# Patient Record
Sex: Female | Born: 1980 | Race: Black or African American | Hispanic: No | Marital: Married | Smoking: Former smoker
Health system: Southern US, Community
[De-identification: ages and names within clinical notes are randomized; demographics above are authoritative.]

## PROBLEM LIST (undated history)

## (undated) DIAGNOSIS — I10 Essential (primary) hypertension: Secondary | ICD-10-CM

## (undated) DIAGNOSIS — J45909 Unspecified asthma, uncomplicated: Secondary | ICD-10-CM

---

## 2015-01-04 ENCOUNTER — Encounter: Payer: Self-pay | Admitting: Emergency Medicine

## 2015-01-04 ENCOUNTER — Emergency Department
Admission: EM | Admit: 2015-01-04 | Discharge: 2015-01-04 | Disposition: A | Discharge status: Home / Self Care | Payer: Self-pay | Attending: Emergency Medicine | Admitting: Emergency Medicine

## 2015-01-04 DIAGNOSIS — I10 Essential (primary) hypertension: Secondary | ICD-10-CM | POA: Insufficient documentation

## 2015-01-04 DIAGNOSIS — R51 Headache: Secondary | ICD-10-CM | POA: Insufficient documentation

## 2015-01-04 DIAGNOSIS — Z87891 Personal history of nicotine dependence: Secondary | ICD-10-CM | POA: Insufficient documentation

## 2015-01-04 HISTORY — DX: Unspecified asthma, uncomplicated: J45.909

## 2015-01-04 HISTORY — DX: Essential (primary) hypertension: I10

## 2015-01-04 MED ORDER — LISINOPRIL 10 MG PO TABS
ORAL_TABLET | ORAL | Status: AC
  Administered 2015-01-04: 20 mg via ORAL
  Filled 2015-01-04: qty 2

## 2015-01-04 MED ORDER — LISINOPRIL 20 MG PO TABS: 20.0000 mg | ORAL_TABLET | Freq: Every day | ORAL | Status: DC

## 2015-01-04 MED ORDER — LISINOPRIL 10 MG PO TABS
20.0000 mg | ORAL_TABLET | Freq: Once | ORAL | Status: AC
  Administered 2015-01-04: 20 mg via ORAL

## 2015-01-04 MED ORDER — HYDROCHLOROTHIAZIDE 25 MG PO TABS: 25.0000 mg | ORAL_TABLET | Freq: Every day | ORAL | Status: DC

## 2015-01-04 MED ORDER — HYDROCHLOROTHIAZIDE 25 MG PO TABS
ORAL_TABLET | ORAL | Status: AC
  Filled 2015-01-04: qty 1

## 2015-01-04 MED ORDER — HYDROCHLOROTHIAZIDE 25 MG PO TABS
25.0000 mg | ORAL_TABLET | Freq: Every day | ORAL | Status: DC
  Administered 2015-01-04: 25 mg via ORAL

## 2015-01-04 NOTE — ED Notes (Signed)
Out of meds x 1 month - headache today

## 2015-01-04 NOTE — ED Notes (Signed)
Patient with no complaints at this time. Respirations even and unlabored. Skin warm/dry. Discharge instructions reviewed with patient at this time. Patient given opportunity to voice concerns/ask questions. Patient discharged at this time and left Emergency Department with steady gait.   

## 2015-01-04 NOTE — Discharge Instructions (Signed)
Hypertension Hypertension, commonly called high blood pressure, is when the force of blood pumping through your arteries is too strong. Your arteries are the blood vessels that carry blood from your heart throughout your body. A blood pressure reading consists of a higher number over a lower number, such as 110/72. The higher number (systolic) is the pressure inside your arteries when your heart pumps. The lower number (diastolic) is the pressure inside your arteries when your heart relaxes. Ideally you want your blood pressure below 120/80. Hypertension forces your heart to work harder to pump blood. Your arteries may become narrow or stiff. Having untreated or uncontrolled hypertension can cause heart attack, stroke, kidney disease, and other problems. RISK FACTORS Some risk factors for high blood pressure are controllable. Others are not.  Risk factors you cannot control include:   Race. You may be at higher risk if you are African American.  Age. Risk increases with age.  Gender. Men are at higher risk than women before age 45 years. After age 65, women are at higher risk than men. Risk factors you can control include:  Not getting enough exercise or physical activity.  Being overweight.  Getting too much fat, sugar, calories, or salt in your diet.  Drinking too much alcohol. SIGNS AND SYMPTOMS Hypertension does not usually cause signs or symptoms. Extremely high blood pressure (hypertensive crisis) may cause headache, anxiety, shortness of breath, and nosebleed. DIAGNOSIS To check if you have hypertension, your health care provider will measure your blood pressure while you are seated, with your arm held at the level of your heart. It should be measured at least twice using the same arm. Certain conditions can cause a difference in blood pressure between your right and left arms. A blood pressure reading that is higher than normal on one occasion does not mean that you need treatment. If  it is not clear whether you have high blood pressure, you may be asked to return on a different day to have your blood pressure checked again. Or, you may be asked to monitor your blood pressure at home for 1 or more weeks. TREATMENT Treating high blood pressure includes making lifestyle changes and possibly taking medicine. Living a healthy lifestyle can help lower high blood pressure. You may need to change some of your habits. Lifestyle changes may include:  Following the DASH diet. This diet is high in fruits, vegetables, and whole grains. It is low in salt, red meat, and added sugars.  Keep your sodium intake below 2,300 mg per day.  Getting at least 30-45 minutes of aerobic exercise at least 4 times per week.  Losing weight if necessary.  Not smoking.  Limiting alcoholic beverages.  Learning ways to reduce stress. Your health care provider may prescribe medicine if lifestyle changes are not enough to get your blood pressure under control, and if one of the following is true:  You are 18-59 years of age and your systolic blood pressure is above 140.  You are 60 years of age or older, and your systolic blood pressure is above 150.  Your diastolic blood pressure is above 90.  You have diabetes, and your systolic blood pressure is over 140 or your diastolic blood pressure is over 90.  You have kidney disease and your blood pressure is above 140/90.  You have heart disease and your blood pressure is above 140/90. Your personal target blood pressure may vary depending on your medical conditions, your age, and other factors. HOME CARE INSTRUCTIONS    Have your blood pressure rechecked as directed by your health care provider.   Take medicines only as directed by your health care provider. Follow the directions carefully. Blood pressure medicines must be taken as prescribed. The medicine does not work as well when you skip doses. Skipping doses also puts you at risk for  problems.  Do not smoke.   Monitor your blood pressure at home as directed by your health care provider. SEEK MEDICAL CARE IF:   You think you are having a reaction to medicines taken.  You have recurrent headaches or feel dizzy.  You have swelling in your ankles.  You have trouble with your vision. SEEK IMMEDIATE MEDICAL CARE IF:  You develop a severe headache or confusion.  You have unusual weakness, numbness, or feel faint.  You have severe chest or abdominal pain.  You vomit repeatedly.  You have trouble breathing. MAKE SURE YOU:   Understand these instructions.  Will watch your condition.  Will get help right away if you are not doing well or get worse.   This information is not intended to replace advice given to you by your health care provider. Make sure you discuss any questions you have with your health care provider.   Document Released: 01/17/2005 Document Revised: 06/03/2014 Document Reviewed: 11/09/2012 Elsevier Interactive Patient Education 2016 [employer]  

## 2015-01-04 NOTE — ED Provider Notes (Signed)
[facility] Emergency Department Provider Note  ____________________________________________  Time seen: Approximately 1745 PM  I have reviewed the triage vital signs and the nursing notes.   HISTORY  Chief Complaint Hypertension    HPI Vanessa Velazquez is a 34 y.o. female with a history of hypertension and asthma who is presenting today with elevated blood pressure. She says she also has a mild headache to the right side of her head on the frontal aspect. She says that she has been out of her blood pressure meds for about a month after moving. She says she has not been able to reestablish primary care and has no insurance. She says she has been out of her blood pressure medications in the past and feels exactly the same. She says that she also has intermittent dizziness when her blood pressure is up which is all typical of how she feels when she has been out of her meds previously. Denies any chest pain or shortness of breath.    Past Medical History  Diagnosis Date  . Hypertension   . Asthma     There are no active problems to display for this patient.   History reviewed. No pertinent past surgical history.  No current outpatient prescriptions on file.  Allergies Review of patient's allergies indicates no known allergies.  History reviewed. No pertinent family history.  Social History Social History  Substance Use Topics  . Smoking status: Former Games developer  . Smokeless tobacco: None  . Alcohol Use: No    Review of Systems Constitutional: No fever/chills Eyes: No visual changes. ENT: No sore throat. Cardiovascular: Denies chest pain. Respiratory: Denies shortness of breath. Gastrointestinal: No abdominal pain.  No nausea, no vomiting.  No diarrhea.  No constipation. Genitourinary: Negative for dysuria. Musculoskeletal: Negative for back pain. Skin: Negative for rash. Neurological: Negative for focal weakness or numbness.  10-point ROS  otherwise negative.  ____________________________________________   PHYSICAL EXAM:  VITAL SIGNS: ED Triage Vitals  Enc Vitals Group     BP 01/04/15 1717 191/103 mmHg     Pulse Rate 01/04/15 1717 85     Resp 01/04/15 1717 20     Temp 01/04/15 1717 98.1 F (36.7 C)     Temp src --      SpO2 01/04/15 1717 100 %     Weight 01/04/15 1717 370 lb (167.831 kg)     Height 01/04/15 1717  (1.676 m)     Head Cir --      Peak Flow --      Pain Score 01/04/15 1718 7     Pain Loc --      Pain Edu? --      Excl. in GC? --     Constitutional: Alert and oriented. Well appearing and in no acute distress. Eyes: Conjunctivae are normal. PERRL. EOMI. Head: Atraumatic. Nose: No congestion/rhinnorhea. Mouth/Throat: Mucous membranes are moist.  Oropharynx non-erythematous. Neck: No stridor.   Cardiovascular: Normal rate, regular rhythm. Grossly normal heart sounds.  Good peripheral circulation. Respiratory: Normal respiratory effort.  No retractions. Lungs CTAB. Gastrointestinal: Soft and nontender. No distention. No abdominal bruits. No CVA tenderness. Musculoskeletal: No lower extremity tenderness nor edema.  No joint effusions. Neurologic:  Normal speech and language. No gross focal neurologic deficits are appreciated. No gait instability. No ataxia on finger to nose testing. Skin:  Skin is warm, dry and intact. No rash noted. Psychiatric: Mood and affect are normal. Speech and behavior are normal.  ____________________________________________  LABS (all labs ordered are listed, but only abnormal results are displayed)  Labs Reviewed - No data to display ____________________________________________  EKG  ED ECG REPORT I, Schaevitz,  Teena Iraniavid M, the attending physician, personally viewed and interpreted this ECG.   Date: 01/04/2015  EKG Time: 1723  Rate: 83  Rhythm: normal EKG, normal sinus rhythm  Axis: Normal axis  Intervals:none  ST&T Change: No ST segment elevation or  depression. No abnormal T-wave inversion.  ____________________________________________  RADIOLOGY   ____________________________________________   PROCEDURES   ____________________________________________   INITIAL IMPRESSION / ASSESSMENT AND PLAN / ED COURSE  Pertinent labs & imaging results that were available during my care of the patient were reviewed by me and considered in my medical decision making (see chart for details).  ----------------------------------------- 7:38 PM on 01/04/2015 -----------------------------------------  Patient's blood pressure greatly improved after by mouth medications and she is now a symptomatically. I will discharge her with prescriptions for 1 month of her lisinopril as well as HCTZ. She [facility] and is in conversation with them to schedule a follow-up appointment. 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 She understands that she left establish primary care Her blood pressures followed. ____________________________________________   FINAL CLINICAL IMPRESSION(S) / ED DIAGNOSES  Uncontrolled hypertension.    Myrna Blazeravid Matthew Schaevitz, MD 01/04/15 [phone]

## 2015-03-02 ENCOUNTER — Emergency Department
Admission: EM | Admit: 2015-03-02 | Discharge: 2015-03-02 | Disposition: A | Discharge status: Home / Self Care | Payer: Self-pay | Attending: Emergency Medicine | Admitting: Emergency Medicine

## 2015-03-02 ENCOUNTER — Encounter: Payer: Self-pay | Admitting: Emergency Medicine

## 2015-03-02 DIAGNOSIS — Z87891 Personal history of nicotine dependence: Secondary | ICD-10-CM | POA: Insufficient documentation

## 2015-03-02 DIAGNOSIS — Z79899 Other long term (current) drug therapy: Secondary | ICD-10-CM | POA: Insufficient documentation

## 2015-03-02 DIAGNOSIS — I10 Essential (primary) hypertension: Secondary | ICD-10-CM | POA: Insufficient documentation

## 2015-03-02 DIAGNOSIS — H6991 Unspecified Eustachian tube disorder, right ear: Secondary | ICD-10-CM | POA: Insufficient documentation

## 2015-03-02 DIAGNOSIS — H6981 Other specified disorders of Eustachian tube, right ear: Secondary | ICD-10-CM

## 2015-03-02 MED ORDER — PREDNISONE 10 MG PO TABS: ORAL_TABLET | ORAL | Status: AC

## 2015-03-02 NOTE — ED Notes (Signed)
Pt states 2 weeks ago she woke up with left sided jaw and ear pain, denies injury to area.

## 2015-03-02 NOTE — ED Provider Notes (Signed)
[facility] Emergency Department Provider Note  ____________________________________________  Time seen: Approximately 8:56 AM  I have reviewed the triage vital signs and the nursing notes.   HISTORY  Chief Complaint Otalgia and Jaw Pain   HPI Vanessa Velazquez is a 35 y.o. female is here with complaint of bilateral jaw pain and neck pain for approximately 2 weeks. Patient states she noticed that her ears were popping at one time but now right ear is hurting worse than her left. Patient has not been taking any over-the-counter medication other than her allergy medicine which has given her some minimal relief. She denies any fever or chills. There's been no cough. Really she rates her pain as 4 out of 10.   Past Medical History  Diagnosis Date  . Hypertension   . Asthma     There are no active problems to display for this patient.   History reviewed. No pertinent past surgical history.  Current Outpatient Rx  Name  Route  Sig  Dispense  Refill  . hydrochlorothiazide (HYDRODIURIL) 25 MG tablet   Oral   Take 1 tablet (25 mg total) by mouth daily.   30 tablet   0   . lisinopril (PRINIVIL,ZESTRIL) 20 MG tablet   Oral   Take 1 tablet (20 mg total) by mouth daily.   30 tablet   0   . predniSONE (DELTASONE) 10 MG tablet      Take 3 tablets once a day for 3 days   9 tablet   0     Allergies Review of patient's allergies indicates no known allergies.  No family history on file.  Social History Social History  Substance Use Topics  . Smoking status: Former Games developer  . Smokeless tobacco: None  . Alcohol Use: No    Review of Systems Constitutional: No fever/chills Eyes: No visual changes. ENT: No sore throat. Positive ear pain, positive jaw pain. Cardiovascular: Denies chest pain. Respiratory: Denies shortness of breath. Gastrointestinal: No abdominal pain.  No nausea, no vomiting.   Musculoskeletal: Negative for back pain. Skin: Negative  for rash. Neurological: Negative for headaches, focal weakness or numbness.  10-point ROS otherwise negative.  ____________________________________________   PHYSICAL EXAM:  VITAL SIGNS: ED Triage Vitals  Enc Vitals Group     BP 03/02/15 0829 158/77 mmHg     Pulse Rate 03/02/15 0829 79     Resp 03/02/15 0829 18     Temp 03/02/15 0829 98.1 F (36.7 C)     Temp Source 03/02/15 0829 Oral     SpO2 03/02/15 0829 99 %     Weight 03/02/15 0829 370 lb (167.831 kg)     Height 03/02/15 0829  (1.676 m)     Head Cir --      Peak Flow --      Pain Score 03/02/15 0829 4     Pain Loc --      Pain Edu? --      Excl. in GC? --     Constitutional: Alert and oriented. Well appearing and in no acute distress. Eyes: Conjunctivae are normal. PERRL. EOMI. Head: Atraumatic. Nose: No congestion/rhinnorhea.  Right TM with mild fluid, no erythema, slight bulge and poor light reflex. Left EAC and TM are clear. Mouth/Throat: Mucous membranes are moist.  Oropharynx non-erythematous. Mild posterior drainage. No tenderness on palpation of the TMJ bilaterally. No dental caries were noted. Neck: No stridor.  Supple. Range of motion is without any difficulty or restrictions. Hematological/Lymphatic/Immunilogical:  No cervical lymphadenopathy. Cardiovascular: Normal rate, regular rhythm. Grossly normal heart sounds.  Good peripheral circulation. Respiratory: Normal respiratory effort.  No retractions. Lungs CTAB. Gastrointestinal: Soft and nontender. No distention. Musculoskeletal: No lower extremity tenderness nor edema.  No joint effusions. Neurologic:  Normal speech and language. No gross focal neurologic deficits are appreciated. No gait instability. Skin:  Skin is warm, dry and intact. No rash noted. Psychiatric: Mood and affect are normal. Speech and behavior are normal.  ____________________________________________   LABS (all labs ordered are listed, but only abnormal results are  displayed)  Labs Reviewed - No data to display  PROCEDURES  Procedure(s) performed: None  Critical Care performed: No  ____________________________________________   INITIAL IMPRESSION / ASSESSMENT AND PLAN / ED COURSE  Pertinent labs & imaging results that were available during my care of the patient were reviewed by me and considered in my medical decision making (see chart for details).  Patient was unable to give this to pop all in the emergency room. She was given a prescription for prednisone is 30 mg for 3 days and also to continue her allergy medication. She'll follow-up with [facility] if any continued problems. ____________________________________________   FINAL CLINICAL IMPRESSION(S) / ED DIAGNOSES  Final diagnoses:  Eustachian tube dysfunction, right      Tommi Rumps, PA-C 03/02/15 1525  Jeanmarie Plant, MD 03/02/15 [phone]

## 2015-03-02 NOTE — ED Notes (Signed)
States she developed some discomfort to bilateral jaw area and neck pain about 2 weeks ago  No fever   But states she has noticed her ears popping   Min relief with OTC allergy meds

## 2015-03-02 NOTE — Discharge Instructions (Signed)
Follow-up with [facility] if any continued problems. Take medication as directed. Increase fluids and take Tylenol if needed for pain.

## 2015-03-18 ENCOUNTER — Encounter: Payer: Self-pay | Admitting: *Deleted

## 2015-03-18 ENCOUNTER — Emergency Department
Admission: EM | Admit: 2015-03-18 | Discharge: 2015-03-18 | Disposition: A | Discharge status: Home / Self Care | Payer: Self-pay | Attending: Emergency Medicine | Admitting: Emergency Medicine

## 2015-03-18 DIAGNOSIS — R05 Cough: Secondary | ICD-10-CM

## 2015-03-18 DIAGNOSIS — J45901 Unspecified asthma with (acute) exacerbation: Secondary | ICD-10-CM | POA: Insufficient documentation

## 2015-03-18 DIAGNOSIS — Z87891 Personal history of nicotine dependence: Secondary | ICD-10-CM | POA: Insufficient documentation

## 2015-03-18 DIAGNOSIS — J019 Acute sinusitis, unspecified: Secondary | ICD-10-CM | POA: Insufficient documentation

## 2015-03-18 DIAGNOSIS — H9201 Otalgia, right ear: Secondary | ICD-10-CM | POA: Insufficient documentation

## 2015-03-18 DIAGNOSIS — Z79899 Other long term (current) drug therapy: Secondary | ICD-10-CM | POA: Insufficient documentation

## 2015-03-18 DIAGNOSIS — R059 Cough, unspecified: Secondary | ICD-10-CM

## 2015-03-18 MED ORDER — FLUTICASONE PROPIONATE 50 MCG/ACT NA SUSP: 2.0000 | Freq: Every day | NASAL | Status: AC

## 2015-03-18 MED ORDER — PSEUDOEPH-BROMPHEN-DM 30-2-10 MG/5ML PO SYRP: 10.0000 mL | ORAL_SOLUTION | Freq: Four times a day (QID) | ORAL | Status: AC | PRN

## 2015-03-18 MED ORDER — AMOXICILLIN 875 MG PO TABS: 875.0000 mg | ORAL_TABLET | Freq: Two times a day (BID) | ORAL | Status: AC

## 2015-03-18 NOTE — ED Provider Notes (Signed)
[facility] Emergency Department Provider Note  ____________________________________________  Time seen: Approximately 3:21 PM  I have reviewed the triage vital signs and the nursing notes.   HISTORY  Chief Complaint Cough and Nasal Congestion    HPI Vanessa Velazquez is a 35 y.o. female , NAD, presents to emergency with one-week history cough, chest congestion, nasal congestion. Was seen here recently for right-sided ear pain and popping that she states has continued since her visit. Denies any discharge from the ears. Has not had any fever, chills, body aches. Does have history of asthma and utilized her albuterol rescue inhaler once last night due to shortness of breath. Denies chest pain or back pain.   Past Medical History  Diagnosis Date  . Hypertension   . Asthma     There are no active problems to display for this patient.   History reviewed. No pertinent past surgical history.  Current Outpatient Rx  Name  Route  Sig  Dispense  Refill  . amoxicillin (AMOXIL) 875 MG tablet   Oral   Take 1 tablet (875 mg total) by mouth 2 (two) times daily.   20 tablet   0   . brompheniramine-pseudoephedrine-DM 30-2-10 MG/5ML syrup   Oral   Take 10 mLs by mouth 4 (four) times daily as needed.   200 mL   0   . fluticasone (FLONASE) 50 MCG/ACT nasal spray   Each Nare   Place 2 sprays into both nostrils daily.   16 g   0   . hydrochlorothiazide (HYDRODIURIL) 25 MG tablet   Oral   Take 1 tablet (25 mg total) by mouth daily.   30 tablet   0   . lisinopril (PRINIVIL,ZESTRIL) 20 MG tablet   Oral   Take 1 tablet (20 mg total) by mouth daily.   30 tablet   0   . predniSONE (DELTASONE) 10 MG tablet      Take 3 tablets once a day for 3 days   9 tablet   0     Allergies Review of patient's allergies indicates no known allergies.  History reviewed. No pertinent family history.  Social History Social History  Substance Use Topics  . Smoking  status: Former Games developer  . Smokeless tobacco: None  . Alcohol Use: No     Review of Systems  Constitutional: No fever/chills. Some fatigue Eyes: No visual changes. No discharge ENT: Positive nasal congestion, runny nose, sinus pressure, ear pain/pressure. No sore throat. Cardiovascular: No chest pain. Respiratory: Positive cough, shortness of breath. No wheezing.  Gastrointestinal: No abdominal pain.  No nausea, vomiting.   Musculoskeletal: Negative for general myalgias.  Skin: Negative for rash. Neurological: Negative for headaches, focal weakness or numbness. 10-point ROS otherwise negative.  ____________________________________________   PHYSICAL EXAM:  VITAL SIGNS: ED Triage Vitals  Enc Vitals Group     BP 03/18/15 1418 186/103 mmHg     Pulse Rate 03/18/15 1418 91     Resp 03/18/15 1418 20     Temp 03/18/15 1418 98.1 F (36.7 C)     Temp Source 03/18/15 1418 Oral     SpO2 03/18/15 1418 98 %     Weight 03/18/15 1418 370 lb (167.831 kg)     Height 03/18/15 1418  (1.676 m)     Head Cir --      Peak Flow --      Pain Score 03/18/15 1418 0     Pain Loc --  Pain Edu? --      Excl. in GC? --     Constitutional: Alert and oriented. Well appearing and in no acute distress. Eyes: Conjunctivae are normal. PERRL. EOMI without pain.  Head: Atraumatic. ENT:      Ears: Right TM visualized with trace serous effusion, bulging, decreased light reflex, injection. No perforation visualized. Left TM within normal limits. Bilateral external ear canals without erythema, swelling, discharge.      Nose: Moderate congestion with trace rhinnorhea.      Mouth/Throat: Mucous membranes are moist. Pharynx without erythema, swelling, exudate. White postnasal drip noted. Neck: Supple with full range of motion. Hematological/Lymphatic/Immunilogical: No cervical lymphadenopathy. Cardiovascular: Normal rate, regular rhythm. Normal S1 and S2.   Respiratory: Normal respiratory effort  without tachypnea or retractions. Lungs CTAB. Skin:  Skin is warm, dry and intact. No rash noted. Psychiatric: Mood and affect are normal. Speech and behavior are normal. Patient exhibits appropriate insight and judgement.    ____________________________________________   LABS  None  ____________________________________________  EKG  None ____________________________________________  RADIOLOGY  None  ____________________________________________    PROCEDURES  Procedure(s) performed: None    Medications - No data to display   ____________________________________________   INITIAL IMPRESSION / ASSESSMENT AND PLAN / ED COURSE  Patient's diagnosis is consistent with acute bacterial sinusitis with cough. Patient will be discharged home with prescriptions for oxacillin 875 mg tablets to take one tablet by mouth twice daily for 10 days. Will also give Flonase nasal spray as well as Bromfed-DM cough syrup to use as directed. Patient is to follow up with [facility] if symptoms persist past this treatment course. Patient is given ED precautions to return to the ED for any worsening or new symptoms.    ____________________________________________  FINAL CLINICAL IMPRESSION(S) / ED DIAGNOSES  Final diagnoses:  Acute sinusitis, recurrence not specified, unspecified location  Cough      NEW MEDICATIONS STARTED DURING THIS VISIT:  New Prescriptions   AMOXICILLIN (AMOXIL) 875 MG TABLET    Take 1 tablet (875 mg total) by mouth 2 (two) times daily.   BROMPHENIRAMINE-PSEUDOEPHEDRINE-DM 30-2-10 MG/5ML SYRUP    Take 10 mLs by mouth 4 (four) times daily as needed.   FLUTICASONE (FLONASE) 50 MCG/ACT NASAL SPRAY    Place 2 sprays into both nostrils daily.         Hope Pigeon, PA-C 03/18/15 1534  Rockne Menghini, MD 03/18/15 1537

## 2015-03-18 NOTE — Discharge Instructions (Signed)

## 2015-03-18 NOTE — ED Notes (Signed)
States cough and nasal congestion for about a week, denies any productive sputum

## 2015-04-19 DIAGNOSIS — I10 Essential (primary) hypertension: Secondary | ICD-10-CM | POA: Insufficient documentation

## 2015-04-19 DIAGNOSIS — Z7951 Long term (current) use of inhaled steroids: Secondary | ICD-10-CM | POA: Insufficient documentation

## 2015-04-19 DIAGNOSIS — Z792 Long term (current) use of antibiotics: Secondary | ICD-10-CM | POA: Insufficient documentation

## 2015-04-19 DIAGNOSIS — Z9119 Patient's noncompliance with other medical treatment and regimen: Secondary | ICD-10-CM | POA: Insufficient documentation

## 2015-04-19 DIAGNOSIS — Z87891 Personal history of nicotine dependence: Secondary | ICD-10-CM | POA: Insufficient documentation

## 2015-04-19 DIAGNOSIS — Z3202 Encounter for pregnancy test, result negative: Secondary | ICD-10-CM | POA: Insufficient documentation

## 2015-04-19 DIAGNOSIS — Z79899 Other long term (current) drug therapy: Secondary | ICD-10-CM | POA: Insufficient documentation

## 2015-04-19 LAB — BASIC METABOLIC PANEL
ANION GAP: 3 — AB (ref 5–15)
BUN: 15 mg/dL (ref 6–20)
CALCIUM: 8 mg/dL — AB (ref 8.9–10.3)
CO2: 28 mmol/L (ref 22–32)
CREATININE: 0.89 mg/dL (ref 0.44–1.00)
Chloride: 103 mmol/L (ref 101–111)
Glucose, Bld: 177 mg/dL — ABNORMAL HIGH (ref 65–99)
Potassium: 3.8 mmol/L (ref 3.5–5.1)
SODIUM: 134 mmol/L — AB (ref 135–145)

## 2015-04-19 MED ORDER — ACETAMINOPHEN 325 MG PO TABS
650.0000 mg | ORAL_TABLET | Freq: Once | ORAL | Status: AC
  Administered 2015-04-19: 650 mg via ORAL

## 2015-04-19 MED ORDER — ACETAMINOPHEN 325 MG PO TABS
ORAL_TABLET | ORAL | Status: AC
  Filled 2015-04-19: qty 2

## 2015-04-19 NOTE — ED Notes (Signed)
Patient states she was nauseous today and had a headache and decided to check her blood pressure. Her blood pressure at home was 170/90. Patient states she has been out of her B/P meds for two weeks.

## 2015-04-20 ENCOUNTER — Emergency Department
Admission: EM | Admit: 2015-04-20 | Discharge: 2015-04-20 | Disposition: A | Discharge status: Home / Self Care | Payer: Self-pay | Attending: Emergency Medicine | Admitting: Emergency Medicine

## 2015-04-20 DIAGNOSIS — R030 Elevated blood-pressure reading, without diagnosis of hypertension: Secondary | ICD-10-CM

## 2015-04-20 DIAGNOSIS — Z91199 Patient's noncompliance with other medical treatment and regimen due to unspecified reason: Secondary | ICD-10-CM

## 2015-04-20 DIAGNOSIS — Z9119 Patient's noncompliance with other medical treatment and regimen: Secondary | ICD-10-CM

## 2015-04-20 DIAGNOSIS — IMO0001 Reserved for inherently not codable concepts without codable children: Secondary | ICD-10-CM

## 2015-04-20 LAB — PREGNANCY, URINE: PREG TEST UR: NEGATIVE

## 2015-04-20 MED ORDER — HYDROCHLOROTHIAZIDE 25 MG PO TABS: 25.0000 mg | ORAL_TABLET | Freq: Every day | ORAL | Status: AC

## 2015-04-20 MED ORDER — LISINOPRIL 20 MG PO TABS: 20.0000 mg | ORAL_TABLET | Freq: Every day | ORAL | Status: AC

## 2015-04-20 MED ORDER — HYDROCHLOROTHIAZIDE 25 MG PO TABS
ORAL_TABLET | ORAL | Status: AC
  Filled 2015-04-20: qty 1

## 2015-04-20 MED ORDER — HYDROCHLOROTHIAZIDE 25 MG PO TABS
25.0000 mg | ORAL_TABLET | Freq: Once | ORAL | Status: AC
  Administered 2015-04-20: 25 mg via ORAL

## 2015-04-20 MED ORDER — LISINOPRIL 10 MG PO TABS
20.0000 mg | ORAL_TABLET | Freq: Once | ORAL | Status: AC
  Administered 2015-04-20: 20 mg via ORAL
  Filled 2015-04-20: qty 2

## 2015-04-20 MED ORDER — HYDROCHLOROTHIAZIDE 25 MG PO TABS: 25.0000 mg | ORAL_TABLET | Freq: Every day | ORAL | Status: DC

## 2015-04-20 NOTE — Discharge Instructions (Signed)
As discussed, without checking your CT scan there is no way to know for sure that there is nothing significant going on with her headaches but at this time we have low suspicion, you prefer not to have a CT scan and this is not unreasonable but if you have increased headache, or you feel worse in any way including focal numbness or weakness return to the emergency department. We do strongly advise that she take her blood pressure medications as prescribed and that you follow closely with her primary care doctor. We also notice that her sugars a little bit elevated. This is not enough to diagnose you with diabetes but it would be good for you to get that get that rechecked.  Hypertension Hypertension, commonly called high blood pressure, is when the force of blood pumping through your arteries is too strong. Your arteries are the blood vessels that carry blood from your heart throughout your body. A blood pressure reading consists of a higher number over a lower number, such as 110/72. The higher number (systolic) is the pressure inside your arteries when your heart pumps. The lower number (diastolic) is the pressure inside your arteries when your heart relaxes. Ideally you want your blood pressure below 120/80. Hypertension forces your heart to work harder to pump blood. Your arteries may become narrow or stiff. Having untreated or uncontrolled hypertension can cause heart attack, stroke, kidney disease, and other problems. RISK FACTORS Some risk factors for high blood pressure are controllable. Others are not.  Risk factors you cannot control include:   Race. You may be at higher risk if you are African American.  Age. Risk increases with age.  Gender. Men are at higher risk than women before age 42 years. After age 36, women are at higher risk than men. Risk factors you can control include:  Not getting enough exercise or physical activity.  Being overweight.  Getting too much fat, sugar,  calories, or salt in your diet.  Drinking too much alcohol. SIGNS AND SYMPTOMS Hypertension does not usually cause signs or symptoms. Extremely high blood pressure (hypertensive crisis) may cause headache, anxiety, shortness of breath, and nosebleed. DIAGNOSIS To check if you have hypertension, your health care provider will measure your blood pressure while you are seated, with your arm held at the level of your heart. It should be measured at least twice using the same arm. Certain conditions can cause a difference in blood pressure between your right and left arms. A blood pressure reading that is higher than normal on one occasion does not mean that you need treatment. If it is not clear whether you have high blood pressure, you may be asked to return on a different day to have your blood pressure checked again. Or, you may be asked to monitor your blood pressure at home for 1 or more weeks. TREATMENT Treating high blood pressure includes making lifestyle changes and possibly taking medicine. Living a healthy lifestyle can help lower high blood pressure. You may need to change some of your habits. Lifestyle changes may include:  Following the DASH diet. This diet is high in fruits, vegetables, and whole grains. It is low in salt, red meat, and added sugars.  Keep your sodium intake below 2,300 mg per day.  Getting at least 30-45 minutes of aerobic exercise at least 4 times per week.  Losing weight if necessary.  Not smoking.  Limiting alcoholic beverages.  Learning ways to reduce stress. Your health care provider may prescribe medicine  if lifestyle changes are not enough to get your blood pressure under control, and if one of the following is true:  You are 4318-34 years of age and your systolic blood pressure is above 140.  You are 34 years of age or older, and your systolic blood pressure is above 150.  Your diastolic blood pressure is above 90.  You have diabetes, and your  systolic blood pressure is over 140 or your diastolic blood pressure is over 90.  You have kidney disease and your blood pressure is above 140/90.  You have heart disease and your blood pressure is above 140/90. Your personal target blood pressure may vary depending on your medical conditions, your age, and other factors. HOME CARE INSTRUCTIONS  Have your blood pressure rechecked as directed by your health care provider.   Take medicines only as directed by your health care provider. Follow the directions carefully. Blood pressure medicines must be taken as prescribed. The medicine does not work as well when you skip doses. Skipping doses also puts you at risk for problems.  Do not smoke.   Monitor your blood pressure at home as directed by your health care provider. SEEK MEDICAL CARE IF:   You think you are having a reaction to medicines taken.  You have recurrent headaches or feel dizzy.  You have swelling in your ankles.  You have trouble with your vision. SEEK IMMEDIATE MEDICAL CARE IF:  You develop a severe headache or confusion.  You have unusual weakness, numbness, or feel faint.  You have severe chest or abdominal pain.  You vomit repeatedly.  You have trouble breathing. MAKE SURE YOU:   Understand these instructions.  Will watch your condition.  Will get help right away if you are not doing well or get worse.   This information is not intended to replace advice given to you by your health care provider. Make sure you discuss any questions you have with your health care provider.   Document Released: 01/17/2005 Document Revised: 06/03/2014 Document Reviewed: 11/09/2012 Elsevier Interactive Patient Education [employer]2016 [employer]vier Inc.

## 2015-04-20 NOTE — ED Notes (Signed)
Pt c/o of headache rated at 5 out of 10 described as throbbing/pressure. Pt c/o nausea, and malaise. Symptoms began Saturday night.  Pt reports these are her normal symptoms when she runs out of her BP meds. Pt normally takes lisinopril and hydrochlorothiazide.  Pt has been out of meds for 2 weeks. Pt does not currently have PCP to refill prescriptions.  [facility].

## 2015-04-20 NOTE — ED Notes (Signed)
Reviewed d/c instructions, follow-up care and prescriptions with pt. Pt verbalized understanding 

## 2015-04-20 NOTE — ED Provider Notes (Signed)
[facility] Emergency Department Provider Note  ____________________________________________   I have reviewed the triage vital signs and the nursing notes.   HISTORY  Chief Complaint Hypertension    HPI Vanessa Velazquez is a 35 y.o. female his baseline blood pressure is 150-160 systolic states that she ran out of her blood pressure meds over the last 2 weeks and since that time as had mild headaches. She states is not really a headache it's more that she is aware of her pressure being up.These are not severe headaches not worst headache of life. She does not currently have a headache. She does not have any numbness or weakness change in vision or difficulty talking. However she can tell when her blood pressures up. She is here for this reason. She will like a refill of her blood pressure medication. Her next appointment is in a few weeks.  Past Medical History  Diagnosis Date  . Hypertension   . Asthma     There are no active problems to display for this patient.   No past surgical history on file.  Current Outpatient Rx  Name  Route  Sig  Dispense  Refill  . amoxicillin (AMOXIL) 875 MG tablet   Oral   Take 1 tablet (875 mg total) by mouth 2 (two) times daily.   20 tablet   0   . brompheniramine-pseudoephedrine-DM 30-2-10 MG/5ML syrup   Oral   Take 10 mLs by mouth 4 (four) times daily as needed.   200 mL   0   . fluticasone (FLONASE) 50 MCG/ACT nasal spray   Each Nare   Place 2 sprays into both nostrils daily.   16 g   0   . hydrochlorothiazide (HYDRODIURIL) 25 MG tablet   Oral   Take 1 tablet (25 mg total) by mouth daily.   30 tablet   0   . lisinopril (PRINIVIL,ZESTRIL) 20 MG tablet   Oral   Take 1 tablet (20 mg total) by mouth daily.   30 tablet   0   . predniSONE (DELTASONE) 10 MG tablet      Take 3 tablets once a day for 3 days   9 tablet   0     Allergies Review of patient's allergies indicates no known allergies.  No  family history on file.  Social History Social History  Substance Use Topics  . Smoking status: Former Games developer  . Smokeless tobacco: Not on file  . Alcohol Use: No    Review of SystemsConstitutional: No fever/chills Eyes: No visual changes. ENT: No sore throat. No stiff neck no neck pain Cardiovascular: Denies chest pain. Respiratory: Denies shortness of breath. Gastrointestinal:   no vomiting.  No diarrhea.  No constipation. Genitourinary: Negative for dysuria. Musculoskeletal: Negative lower extremity swelling Skin: Negative for rash. Neurological: Negative for headaches, focal weakness or numbness. 10-point ROS otherwise negative.  ____________________________________________   PHYSICAL EXAM:  VITAL SIGNS: ED Triage Vitals  Enc Vitals Group     BP 04/19/15 2219 179/103 mmHg     Pulse Rate 04/19/15 2219 99     Resp 04/19/15 2219 20     Temp 04/19/15 2219 98.2 F (36.8 C)     Temp Source 04/19/15 2219 Oral     SpO2 04/19/15 2219 99 %     Weight 04/19/15 2219 370 lb (167.831 kg)     Height 04/19/15 2219  (1.676 m)     Head Cir --      Peak Flow --  Pain Score 04/19/15 2239 7     Pain Loc --      Pain Edu? --      Excl. in GC? --     Constitutional: Alert and oriented. Well appearing and in no acute distress. Eyes: Conjunctivae are normal. PERRL. EOMI. Head: Atraumatic. Nose: No congestion/rhinnorhea. Mouth/Throat: Mucous membranes are moist.  Oropharynx non-erythematous. Neck: No stridor.   Nontender with no meningismus Cardiovascular: Normal rate, regular rhythm. Grossly normal heart sounds.  Good peripheral circulation. Respiratory: Normal respiratory effort.  No retractions. Lungs CTAB. Abdominal: Soft and nontender. No distention. No guarding no rebound Back:  There is no focal tenderness or step off there is no midline tenderness there are no lesions noted. there is no CVA tenderness Musculoskeletal: No lower extremity tenderness. No joint  effusions, no DVT signs strong distal pulses no edema Neurologic:  Normal speech and language. No gross focal neurologic deficits are appreciated.  Skin:  Skin is warm, dry and intact. No rash noted. Psychiatric: Mood and affect are normal. Speech and behavior are normal.  ____________________________________________   LABS (all labs ordered are listed, but only abnormal results are displayed)  Labs Reviewed  BASIC METABOLIC PANEL - Abnormal; Notable for the following:    Sodium 134 (*)    Glucose, Bld 177 (*)    Calcium 8.0 (*)    Anion gap 3 (*)    All other components within normal limits  PREGNANCY, URINE   ____________________________________________  EKG  I personally interpreted any EKGs ordered by me or triage Normal sinus rhythm rate 89 bpm no acute ST elevation or acute ST depression normal axis unremarkable EKG ____________________________________________  RADIOLOGY  I reviewed any imaging ordered by me or triage that were performed during my shift and, if possible, patient and/or family made aware of any abnormal findings. ____________________________________________   PROCEDURES  Procedure(s) performed: None  Critical Care performed: None  ____________________________________________   INITIAL IMPRESSION / ASSESSMENT AND PLAN / ED COURSE  Pertinent labs & imaging results that were available during my care of the patient were reviewed by me and considered in my medical decision making (see chart for details).  Patient with hypertension and noncompliance. Blood pressure is mildly elevated for her baseline, there is no evidence of bleed. Tonight discussed a CT scan in the limits of her workup without a but she would prefer not to have a CAT scan I do not think is unreasonable as I do not think she likely has an acute head bleed. However, extensive return precautions and follow-up have been given for this. Patient has no headache at this time. There is no  evidence of renal insufficiency. Her sugars mildly elevated and I have advised her to follow closely with her primary care doctor for recheck. Otherwise, patient will return to the emergency room for new or worrisome symptoms and we'll rewrite her prescription's. ____________________________________________   FINAL CLINICAL IMPRESSION(S) / ED DIAGNOSES  Final diagnoses:  None      This chart was dictated using voice recognition software.  Despite best efforts to proofread,  errors can occur which can change meaning.    Jeanmarie PlantJames A Ariyah Sedlack, MD 04/20/15 [phone]

## 2015-09-06 ENCOUNTER — Emergency Department: Payer: Self-pay

## 2015-09-06 ENCOUNTER — Encounter: Payer: Self-pay | Admitting: Emergency Medicine

## 2015-09-06 ENCOUNTER — Emergency Department
Admission: EM | Admit: 2015-09-06 | Discharge: 2015-09-07 | Disposition: A | Discharge status: Home / Self Care | Payer: Self-pay | Attending: Emergency Medicine | Admitting: Emergency Medicine

## 2015-09-06 DIAGNOSIS — Z7951 Long term (current) use of inhaled steroids: Secondary | ICD-10-CM | POA: Insufficient documentation

## 2015-09-06 DIAGNOSIS — J45909 Unspecified asthma, uncomplicated: Secondary | ICD-10-CM | POA: Insufficient documentation

## 2015-09-06 DIAGNOSIS — I10 Essential (primary) hypertension: Secondary | ICD-10-CM | POA: Insufficient documentation

## 2015-09-06 DIAGNOSIS — N939 Abnormal uterine and vaginal bleeding, unspecified: Secondary | ICD-10-CM

## 2015-09-06 DIAGNOSIS — N938 Other specified abnormal uterine and vaginal bleeding: Secondary | ICD-10-CM | POA: Insufficient documentation

## 2015-09-06 DIAGNOSIS — Z87891 Personal history of nicotine dependence: Secondary | ICD-10-CM | POA: Insufficient documentation

## 2015-09-06 DIAGNOSIS — R102 Pelvic and perineal pain: Secondary | ICD-10-CM

## 2015-09-06 LAB — URINALYSIS COMPLETE WITH MICROSCOPIC (ARMC ONLY)
Bilirubin Urine: NEGATIVE
Glucose, UA: 50 mg/dL — AB
Ketones, ur: NEGATIVE mg/dL
NITRITE: NEGATIVE
PH: 6 (ref 5.0–8.0)
PROTEIN: 30 mg/dL — AB
SPECIFIC GRAVITY, URINE: 1.023 (ref 1.005–1.030)

## 2015-09-06 LAB — WET PREP, GENITAL
Clue Cells Wet Prep HPF POC: NONE SEEN
Sperm: NONE SEEN
Trich, Wet Prep: NONE SEEN
Yeast Wet Prep HPF POC: NONE SEEN

## 2015-09-06 LAB — CHLAMYDIA/NGC RT PCR (ARMC ONLY)
Chlamydia Tr: NOT DETECTED
N GONORRHOEAE: NOT DETECTED

## 2015-09-06 NOTE — ED Triage Notes (Signed)
Pt ambulatory to triage with no difficulty. Pt reports she has PCOS and has been having heavy bleeding and pain but reports having worse pain in her vagina than normal. Pt reports she has birth control implant that was placed in May.

## 2015-09-06 NOTE — ED Provider Notes (Signed)
[facility] Emergency Department Provider Note   ____________________________________________   First MD Initiated Contact with Patient 09/06/15 2135     (approximate)  I have reviewed the triage vital signs and the nursing notes.   HISTORY  Chief Complaint Vaginal Pain and Vaginal Bleeding   HPI Vanessa Velazquez is a 35 y.o. female with a history of polycystic ovarian disease as well as hypertension is presenting with several months of vaginal pain. She says that she is also having intermittent bleeding but is only spotting at this time. She says that she lives at the baseline level of pain because of her PCOS.  Is not concerned about sexually transmitted diseases. Denies any nausea vomiting or diarrhea. Says that her pain is 8 out of 10 at this time. Denies any radiation. Denies any worsening with walking. Says that she finally came to the emergency department today because it is now affecting her day-to-day life because she is in so much pain. She plans [facility] with [facility].   Past Medical History:  Diagnosis Date  . Asthma   . Hypertension     There are no active problems to display for this patient.   No past surgical history on file.  Prior to Admission medications   Medication Sig Start Date End Date Taking? Authorizing Provider  amoxicillin (AMOXIL) 875 MG tablet Take 1 tablet (875 mg total) by mouth 2 (two) times daily. 03/18/15   Jami L Hagler, PA-C  brompheniramine-pseudoephedrine-DM 30-2-10 MG/5ML syrup Take 10 mLs by mouth 4 (four) times daily as needed. 03/18/15   Jami L Hagler, PA-C  fluticasone (FLONASE) 50 MCG/ACT nasal spray Place 2 sprays into both nostrils daily. 03/18/15   Jami L Hagler, PA-C  hydrochlorothiazide (HYDRODIURIL) 25 MG tablet Take 1 tablet (25 mg total) by mouth daily. 04/20/15   Jeanmarie Plant, MD  lisinopril (PRINIVIL,ZESTRIL) 20 MG tablet Take 1 tablet (20 mg total) by mouth daily. 04/20/15 04/19/16   Jeanmarie Plant, MD  predniSONE (DELTASONE) 10 MG tablet Take 3 tablets once a day for 3 days 03/02/15   Tommi Rumps, PA-C    Allergies Review of patient's allergies indicates no known allergies.  No family history on file.  Social History Social History  Substance Use Topics  . Smoking status: Former Games developer  . Smokeless tobacco: Not on file  . Alcohol use No    Review of Systems Constitutional: No fever/chills Eyes: No visual changes. ENT: No sore throat. Cardiovascular: Denies chest pain. Respiratory: Denies shortness of breath. Gastrointestinal: No abdominal pain.  No nausea, no vomiting.  No diarrhea.  No constipation. Genitourinary: Negative for dysuria. Musculoskeletal: Negative for back pain. Skin: Negative for rash. Neurological: Negative for headaches, focal weakness or numbness.  10-point ROS otherwise negative.  ____________________________________________   PHYSICAL EXAM:  VITAL SIGNS: ED Triage Vitals  Enc Vitals Group     BP 09/06/15 2127 (!) 202/96     Pulse Rate 09/06/15 2127 86     Resp 09/06/15 2127 20     Temp 09/06/15 2127 98.6 F (37 C)     Temp Source 09/06/15 2127 Oral     SpO2 09/06/15 2127 100 %     Weight 09/06/15 2127 (!) 370 lb (167.8 kg)     Height 09/06/15 2127  (1.676 m)     Head Circumference --      Peak Flow --      Pain Score 09/06/15 2128 8  Pain Loc --      Pain Edu? --      Excl. in GC? --     Constitutional: Alert and oriented. Well appearing and in no acute distress.Patient is morbidly obese Eyes: Conjunctivae are normal. PERRL. EOMI. Head: Atraumatic. Nose: No congestion/rhinnorhea. Mouth/Throat: Mucous membranes are moist.   Neck: No stridor.   Cardiovascular: Normal rate, regular rhythm. Grossly normal heart sounds.  Good peripheral circulation. Respiratory: Normal respiratory effort.  No retractions. Lungs CTAB. Gastrointestinal: Soft and nontender. No distention. No CVA tenderness. Genitourinary:  Normal external exam. Speculum exam with a small amount of blood without any pulling or active bleeding from the cervix. Bimanual exam with mild uterine tenderness and mild CMT. Mild left adnexal tenderness without any palpable masses. No right adnexal tenderness nor masses. Musculoskeletal: No lower extremity tenderness nor edema.  No joint effusions. Neurologic:  Normal speech and language. No gross focal neurologic deficits are appreciated. Skin:  Skin is warm, dry and intact. No rash noted. Psychiatric: Mood and affect are normal. Speech and behavior are normal.  ____________________________________________   LABS (all labs ordered are listed, but only abnormal results are displayed)  Labs Reviewed  WET PREP, GENITAL - Abnormal; Notable for the following:       Result Value   WBC, Wet Prep HPF POC FEW (*)    All other components within normal limits  URINALYSIS COMPLETEWITH MICROSCOPIC ([facility] ONLY) - Abnormal; Notable for the following:    Color, Urine YELLOW (*)    APPearance CLOUDY (*)    Glucose, UA 50 (*)    Hgb urine dipstick 3+ (*)    Protein, ur 30 (*)    Leukocytes, UA TRACE (*)    Bacteria, UA RARE (*)    Squamous Epithelial / LPF 0-5 (*)    All other components within normal limits  CHLAMYDIA/NGC RT PCR ([facility] ONLY)  POC URINE PREG, ED   ____________________________________________  EKG   ____________________________________________  RADIOLOGY   ____________________________________________   PROCEDURES  Procedure(s) performed:   Procedures  Critical Care performed:   ____________________________________________   INITIAL IMPRESSION / ASSESSMENT AND PLAN / ED COURSE  Pertinent labs & imaging results that were available during my care of the patient were reviewed by me and considered in my medical decision making (see chart for details).  ----------------------------------------- 11:10 PM on  09/06/2015 ----------------------------------------- Pending ultrasound at this time. Patient with few white blood cells on her wet prep. Urine with blood and white blood cells. Will send a urine culture but low suspicion for UTI. White blood cells likely from the vaginal bleeding. Most likely plan will be for the patient to follow up as an outpatient as long as the ultrasound of the pelvis is normal. Signed out to Dr. Huel CoteQuigley.   Clinical Course     ____________________________________________   FINAL CLINICAL IMPRESSION(S) / ED DIAGNOSES  Final diagnoses:  Pelvic pain in female  Pelvic pain in female      NEW MEDICATIONS STARTED DURING THIS VISIT:  New Prescriptions   No medications on file     Note:  This document was prepared using Dragon voice recognition software and may include unintentional dictation errors.    Myrna Blazeravid Matthew Shuree Brossart, MD 09/06/15 [phone]

## 2015-09-07 NOTE — Discharge Instructions (Signed)
Please return immediately if condition worsens. Please contact her primary physician or the physician you were given for referral. If you have any specialist physicians involved in her treatment and plan please also contact them. Thank you for using [facility].  Return to the emergency department especially if he have bleeding of more than 1 full pad per hour for 4 consecutive hours, fever, or any other new concerns. Urine culture is pending

## 2015-09-07 NOTE — ED Provider Notes (Addendum)
-----------------------------------------   1:45 AM on 09/07/2015 -----------------------------------------   Blood pressure (!) 202/96, pulse 86, temperature 98.6 F (37 C), temperature source Oral, resp. rate 20, height 5\' 6"  (1.676 m), weight (!) 370 lb (167.8 kg), SpO2 100 %.  Assuming care from Dr. Pershing ProudSchaevitz.  In short, Vanessa Velazquez is a 35 y.o. female with a chief complaint of Vaginal Pain and Vaginal Bleeding .  Refer to the original H&P for additional details.  The current plan of care is to *following the results of her ultrasound The ultrasound overall did not show any significant unexpected findings and she does not appear to have an ovarian torsion. Patient was resting comfortably and repeat exam of the abdomen shows no peritoneal signs. She is currently on implants and takes metformin. Patient was advised continue with her follow-up with her OB/GYN and see if she can move her appointment up from a month away.  I also added a urine culture since her urine is likely to be a contaminated sample. She does not express any urinary symptoms.  CLINICAL DATA:  Mid pelvic and vaginal pain for 2 days. Patient reports history of polycystic ovarian syndrome.  EXAM: TRANSABDOMINAL AND TRANSVAGINAL ULTRASOUND OF PELVIS  DOPPLER ULTRASOUND OF OVARIES  TECHNIQUE: Both transabdominal and transvaginal ultrasound examinations of the pelvis were performed. Transabdominal technique was performed for global imaging of the pelvis including uterus, ovaries, adnexal regions, and pelvic cul-de-sac.  It was necessary to proceed with endovaginal exam following the transabdominal exam to visualize the uterus ovaries and adnexa. Color and duplex Doppler ultrasound was utilized to evaluate blood flow to the ovaries.  COMPARISON:  None.  FINDINGS: Uterus  Measurements: 8.3 x 5.1 x 6.4 cm. Posterior fibroid measures 3.1 x 2.6 x 2.3 cm  Endometrium  Thickness: 5 mm.  No focal  abnormality visualized.  Right ovary  Measurements: 3.9 x 2.6 x 2.5 cm. There is normal blood flow. Question of peripherally distributed follicles, but no increase in number. No adnexal mass.  Left ovary  Measurements: 3.2 x 2.5 x 2.5 cm. There is normal blood flow. Question of peripherally distributed follicles, but no increase in number.  Pulsed Doppler evaluation of both ovaries demonstrates normal low-resistance arterial and venous waveforms.  Other findings  No abnormal free fluid.  IMPRESSION: 1. Normal blood flow to both ovaries without torsion. 2. Question of peripherally distributed follicles in both ovaries which can be seen in the setting of polycystic ovarian syndrome. 3. Posterior uterine fibroid measures 3.1 cm.   Electronically Signed   By: Rubye OaksMelanie  Ehinger M.D.   On: 09/07/2015 00:31   Vanessa MoccasinBrian S Blue Ruggerio, MD 09/07/15 [phone]    Vanessa MoccasinBrian S Abyan Cadman, MD 09/07/15 [phone]

## 2015-09-08 LAB — URINE CULTURE

## 2015-10-13 ENCOUNTER — Encounter: Payer: Self-pay | Admitting: Obstetrics and Gynecology

## 2016-01-29 ENCOUNTER — Emergency Department
Admission: EM | Admit: 2016-01-29 | Discharge: 2016-01-29 | Disposition: A | Discharge status: Home / Self Care | Payer: Self-pay | Attending: Emergency Medicine | Admitting: Emergency Medicine

## 2016-01-29 DIAGNOSIS — I10 Essential (primary) hypertension: Secondary | ICD-10-CM | POA: Insufficient documentation

## 2016-01-29 DIAGNOSIS — Z87891 Personal history of nicotine dependence: Secondary | ICD-10-CM | POA: Insufficient documentation

## 2016-01-29 DIAGNOSIS — J45909 Unspecified asthma, uncomplicated: Secondary | ICD-10-CM | POA: Insufficient documentation

## 2016-01-29 DIAGNOSIS — R739 Hyperglycemia, unspecified: Secondary | ICD-10-CM | POA: Insufficient documentation

## 2016-01-29 DIAGNOSIS — Z79899 Other long term (current) drug therapy: Secondary | ICD-10-CM | POA: Insufficient documentation

## 2016-01-29 LAB — COMPREHENSIVE METABOLIC PANEL
ALBUMIN: 3.5 g/dL (ref 3.5–5.0)
ALK PHOS: 104 U/L (ref 38–126)
ALT: 20 U/L (ref 14–54)
ANION GAP: 4 — AB (ref 5–15)
AST: 18 U/L (ref 15–41)
BUN: 11 mg/dL (ref 6–20)
CALCIUM: 8.4 mg/dL — AB (ref 8.9–10.3)
CO2: 28 mmol/L (ref 22–32)
Chloride: 103 mmol/L (ref 101–111)
Creatinine, Ser: 0.92 mg/dL (ref 0.44–1.00)
GFR calc non Af Amer: >60 mL/min (ref >60)
GLUCOSE: 419 mg/dL — AB (ref 65–99)
POTASSIUM: 4 mmol/L (ref 3.5–5.1)
SODIUM: 135 mmol/L (ref 135–145)
TOTAL PROTEIN: 7.2 g/dL (ref 6.5–8.1)
Total Bilirubin: 0.4 mg/dL (ref 0.3–1.2)

## 2016-01-29 LAB — CBC
HCT: 43.1 % (ref 35.0–47.0)
Hemoglobin: 14.3 g/dL (ref 12.0–16.0)
MCH: 26.3 pg (ref 26.0–34.0)
MCHC: 33.1 g/dL (ref 32.0–36.0)
MCV: 79.3 fL — ABNORMAL LOW (ref 80.0–100.0)
Platelets: 145 10*3/uL — ABNORMAL LOW (ref 150–440)
RBC: 5.44 MIL/uL — ABNORMAL HIGH (ref 3.80–5.20)
RDW: 16.5 % — AB (ref 11.5–14.5)
WBC: 9.8 10*3/uL (ref 3.6–11.0)

## 2016-01-29 LAB — URINALYSIS, COMPLETE (UACMP) WITH MICROSCOPIC
BACTERIA UA: NONE SEEN
BILIRUBIN URINE: NEGATIVE
Ketones, ur: NEGATIVE mg/dL
NITRITE: NEGATIVE
Protein, ur: NEGATIVE mg/dL
SPECIFIC GRAVITY, URINE: 1.025 (ref 1.005–1.030)
pH: 6 (ref 5.0–8.0)

## 2016-01-29 LAB — GLUCOSE, CAPILLARY
Glucose-Capillary: 361 mg/dL — ABNORMAL HIGH (ref 65–99)
Glucose-Capillary: 297 mg/dL — ABNORMAL HIGH (ref 65–99)

## 2016-01-29 LAB — POCT PREGNANCY, URINE: Preg Test, Ur: NEGATIVE

## 2016-01-29 MED ORDER — SODIUM CHLORIDE 0.9 % IV BOLUS (SEPSIS)
1000.0000 mL | Freq: Once | INTRAVENOUS | Status: AC
  Administered 2016-01-29: 1000 mL via INTRAVENOUS

## 2016-01-29 MED ORDER — HYDROCHLOROTHIAZIDE 25 MG PO TABS
25.0000 mg | ORAL_TABLET | Freq: Once | ORAL | Status: AC
  Administered 2016-01-29: 25 mg via ORAL

## 2016-01-29 MED ORDER — HYDROCHLOROTHIAZIDE 25 MG PO TABS: 25.0000 mg | ORAL_TABLET | Freq: Every day | ORAL | Status: DC

## 2016-01-29 NOTE — ED Provider Notes (Signed)
[facility] Emergency Department Provider Note  ____________________________________________  Time seen: Approximately 3:55 AM  I have reviewed the triage vital signs and the nursing notes.   HISTORY  Chief Complaint Hyperglycemia   HPI Vanessa Velazquez is a 35 y.o. female with a history of PCOS on metformin, obesity, and hypertension who presents for evaluation of elevated glucose. Patient reports for the last week she has been feeling very tired, has been urinating a lot and has had increased thirst. She reports that she was checking her glucose as her grandmother who is a diabetic was here with her glucometer and she noticed for the last week that her blood glucose has been in the 400s. Patient reports she last saw her primary care doctor 6 months ago and at that time she did not have any evidence of diabetes. She tells me that she's been on metformin for many years now because of her PCOS. She has a very strong family history of type 2 diabetes. Patient reports noncompliance with her hydrochlorothiazide for the last few days because she was already urinating a lot and did not want the medication to cause her to urinate even more. She has been taking her lisinopril according to her. Patient also endorses a mild generalized throbbing headache that she says she always has when her blood pressure is high. Patient denies dysuria, hematuria, nausea, vomiting, diarrhea, URI symptoms, shortness of breath, cough, sore throat.  Past Medical History:  Diagnosis Date  . Asthma   . Hypertension     There are no active problems to display for this patient.   No past surgical history on file.  Prior to Admission medications   Medication Sig Start Date End Date Taking? Authorizing Provider  amoxicillin (AMOXIL) 875 MG tablet Take 1 tablet (875 mg total) by mouth 2 (two) times daily. 03/18/15   Jami L Hagler, PA-C  brompheniramine-pseudoephedrine-DM 30-2-10 MG/5ML syrup Take  10 mLs by mouth 4 (four) times daily as needed. 03/18/15   Jami L Hagler, PA-C  fluticasone (FLONASE) 50 MCG/ACT nasal spray Place 2 sprays into both nostrils daily. 03/18/15   Jami L Hagler, PA-C  hydrochlorothiazide (HYDRODIURIL) 25 MG tablet Take 1 tablet (25 mg total) by mouth daily. 04/20/15   Jeanmarie PlantJames A McShane, MD  lisinopril (PRINIVIL,ZESTRIL) 20 MG tablet Take 1 tablet (20 mg total) by mouth daily. 04/20/15 04/19/16  Jeanmarie PlantJames A McShane, MD  predniSONE (DELTASONE) 10 MG tablet Take 3 tablets once a day for 3 days 03/02/15   Tommi Rumpshonda L Summers, PA-C    Allergies Patient has no known allergies.  No family history on file.  Social History Social History  Substance Use Topics  . Smoking status: Former Games developermoker  . Smokeless tobacco: Not on file  . Alcohol use No    Review of Systems  Constitutional: Negative for fever. + fatigue, polyuria, polydpsia Eyes: Negative for visual changes. ENT: Negative for sore throat. Neck: No neck pain  Cardiovascular: Negative for chest pain. Respiratory: Negative for shortness of breath. Gastrointestinal: Negative for abdominal pain, vomiting or diarrhea. Genitourinary: Negative for dysuria. Musculoskeletal: Negative for back pain. Skin: Negative for rash. Neurological: Negative forweakness or numbness. + HA Psych: No SI or HI  ____________________________________________   PHYSICAL EXAM:  VITAL SIGNS: ED Triage Vitals [01/29/16 0149]  Enc Vitals Group     BP (!) 193/117     Pulse Rate 80     Resp 18     Temp 98.1 F (36.7 C)  Temp Source Oral     SpO2 99 %     Weight (!) 372 lb (168.7 kg)     Height 5\' 6"  (1.676 m)     Head Circumference      Peak Flow      Pain Score      Pain Loc      Pain Edu?      Excl. in GC?     Constitutional: Alert and oriented. Well appearing and in no apparent distress. HEENT:      Head: Normocephalic and atraumatic.         Eyes: Conjunctivae are normal. Sclera is non-icteric. EOMI. PERRL       Mouth/Throat: Mucous membranes are moist.       Neck: Supple with no signs of meningismus. Cardiovascular: Regular rate and rhythm. No murmurs, gallops, or rubs. 2+ symmetrical distal pulses are present in all extremities. No JVD. Respiratory: Normal respiratory effort. Lungs are clear to auscultation bilaterally. No wheezes, crackles, or rhonchi.  Gastrointestinal: Soft, non tender, and non distended with positive bowel sounds. No rebound or guarding. Musculoskeletal: Nontender with normal range of motion in all extremities. No edema, cyanosis, or erythema of extremities. Neurologic: Normal speech and language. A & O x3, PERRL, no nystagmus, CN II-XII intact, motor testing reveals good tone and bulk throughout. There is no evidence of pronator drift or dysmetria. Muscle strength is 5/5 throughout. Deep tendon reflexes are 2+ throughout with downgoing toes. Sensory examination is intact. Gait is normal. Skin: Skin is warm, dry and intact. No rash noted. Psychiatric: Mood and affect are normal. Speech and behavior are normal.  ____________________________________________   LABS (all labs ordered are listed, but only abnormal results are displayed)  Labs Reviewed  CBC - Abnormal; Notable for the following:       Result Value   RBC 5.44 (*)    MCV 79.3 (*)    RDW 16.5 (*)    Platelets 145 (*)    All other components within normal limits  COMPREHENSIVE METABOLIC PANEL - Abnormal; Notable for the following:    Glucose, Bld 419 (*)    Calcium 8.4 (*)    Anion gap 4 (*)    All other components within normal limits  URINALYSIS, COMPLETE (UACMP) WITH MICROSCOPIC - Abnormal; Notable for the following:    Color, Urine STRAW (*)    APPearance CLEAR (*)    Glucose, UA >=500 (*)    Hgb urine dipstick LARGE (*)    Leukocytes, UA TRACE (*)    Squamous Epithelial / LPF 0-5 (*)    All other components within normal limits  GLUCOSE, CAPILLARY - Abnormal; Notable for the following:     Glucose-Capillary 361 (*)    All other components within normal limits  GLUCOSE, CAPILLARY - Abnormal; Notable for the following:    Glucose-Capillary 297 (*)    All other components within normal limits  POC URINE PREG, ED  POCT PREGNANCY, URINE   ____________________________________________  EKG  none ____________________________________________  RADIOLOGY  none  ____________________________________________   PROCEDURES  Procedure(s) performed: None Procedures Critical Care performed:  None ____________________________________________   INITIAL IMPRESSION / ASSESSMENT AND PLAN / ED COURSE   35 y.o. female with a history of PCOS on metformin, obesity, and hypertension who presents for evaluation of elevated glucose x 1 week. Patient found to have a blood glucose of 419 with normal bicarbonate, normal anion gap, and no ketones in her urine. Patient will be given 1L IVF  for hyperglycemia and BG will be re-checked. She is already on metformin 1000mg  BID. UA showing blood and patient is currently on her menstrual period but no evidence of UTI.   Clinical Course as of Jan 28 502  Fri Jan 29, 2016  0502 BG 297, will dc home with close f/u with PCP for further management of DM.  [CV]    Clinical Course User Index [CV] Nita Sickle, MD    Pertinent labs & imaging results that were available during my care of the patient were reviewed by me and considered in my medical decision making (see chart for details).    ____________________________________________   FINAL CLINICAL IMPRESSION(S) / ED DIAGNOSES  Final diagnoses:  Hyperglycemia      NEW MEDICATIONS STARTED DURING THIS VISIT:  New Prescriptions   No medications on file     Note:  This document was prepared using Dragon voice recognition software and may include unintentional dictation errors.    Nita Sickle, MD 01/29/16 [phone]

## 2016-01-29 NOTE — ED Triage Notes (Signed)
Pt states she feels like her fsbs has been high in the 400's the last few days, has not been dx as diabetic but feels like glucose has been high for some time. States co nausea, and headache.

## 2017-10-08 IMAGING — US US PELVIS COMPLETE
1 series · 13 of 25 positions shown · non-contrast
Comparison: None.

CLINICAL DATA: Mid pelvic and vaginal pain for 2 days. Patient
reports history of polycystic ovarian syndrome.

EXAM:
TRANSABDOMINAL AND TRANSVAGINAL ULTRASOUND OF PELVIS
DOPPLER ULTRASOUND OF OVARIES
TECHNIQUE: Both transabdominal and transvaginal ultrasound examinations of the
pelvis were performed. Transabdominal technique was performed for
global imaging of the pelvis including uterus, ovaries, adnexal
regions, and pelvic cul-de-sac.
It was necessary to proceed with endovaginal exam following the
transabdominal exam to visualize the uterus ovaries and adnexa.
Color and duplex Doppler ultrasound was utilized to evaluate blood
flow to the ovaries.

[Series 1: us pelvis complete · 0.28mm/px · 13 of 95 slices shown]
[im 1/95]
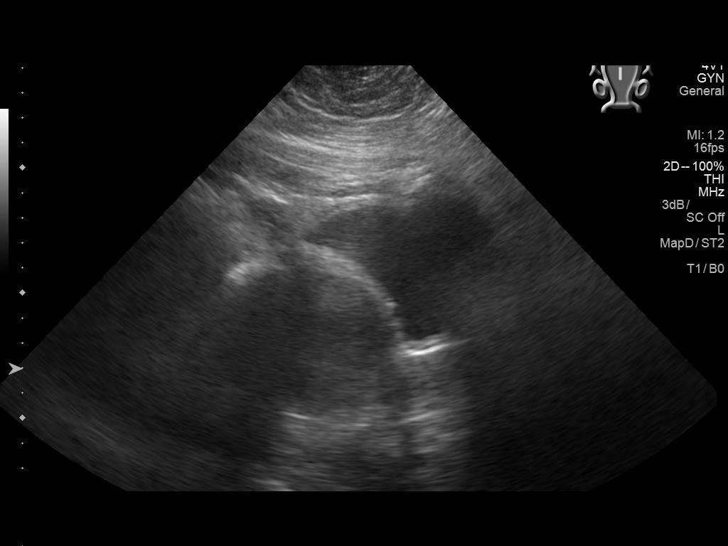
[im 8/95]
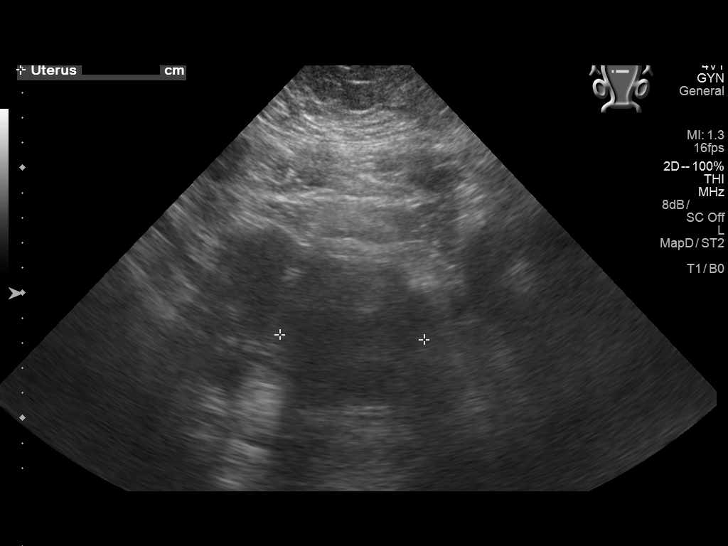
[im 16/95]
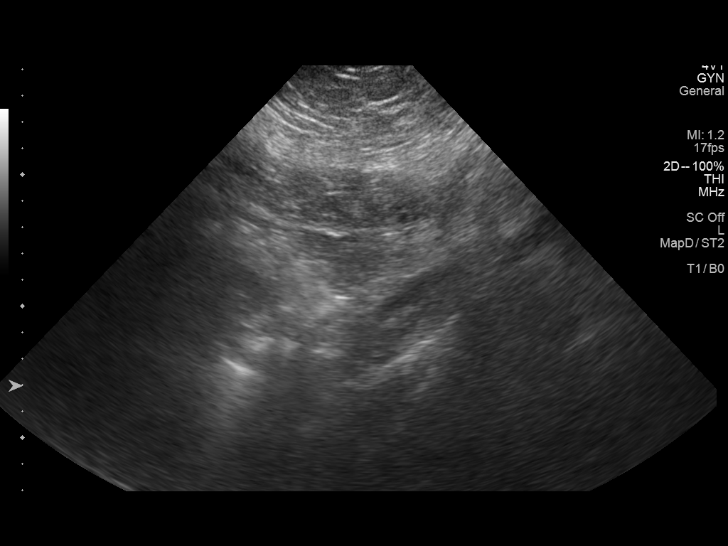
[im 24/95]
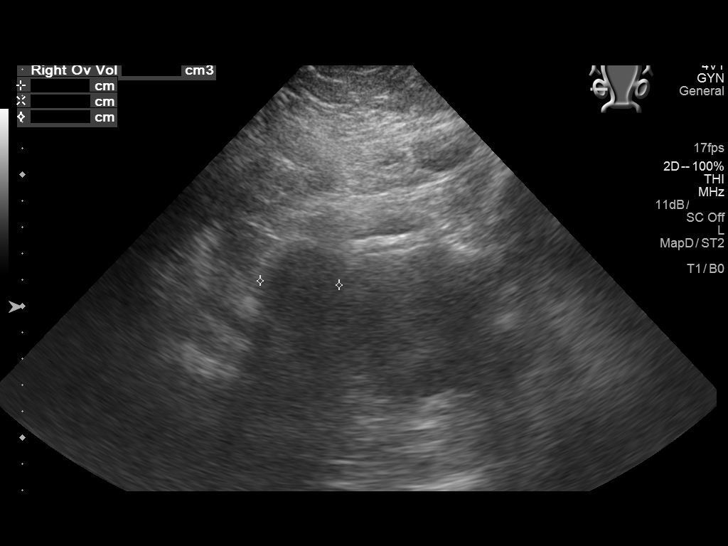
[im 32/95]
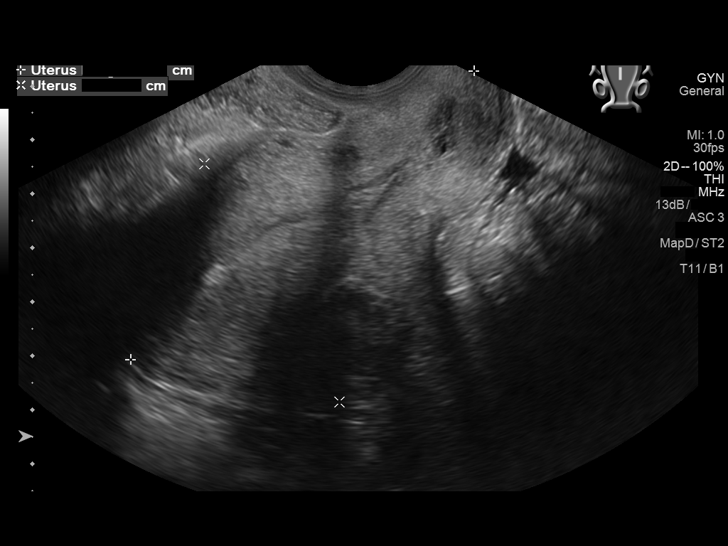
[im 40/95]
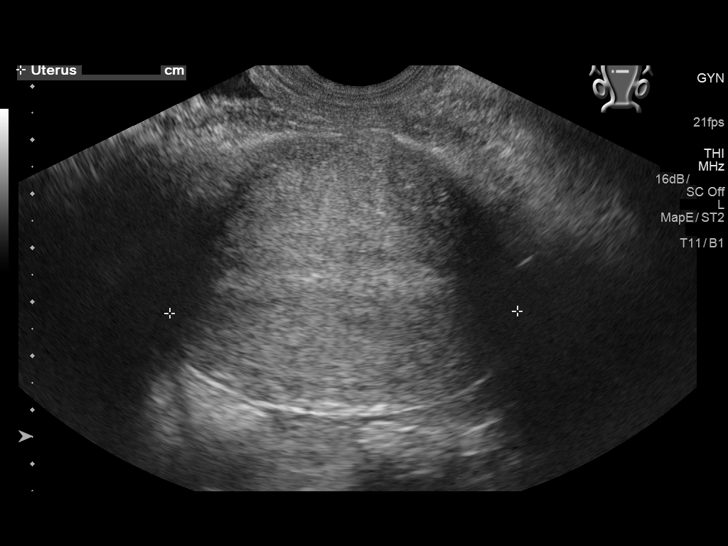
[im 48/95]
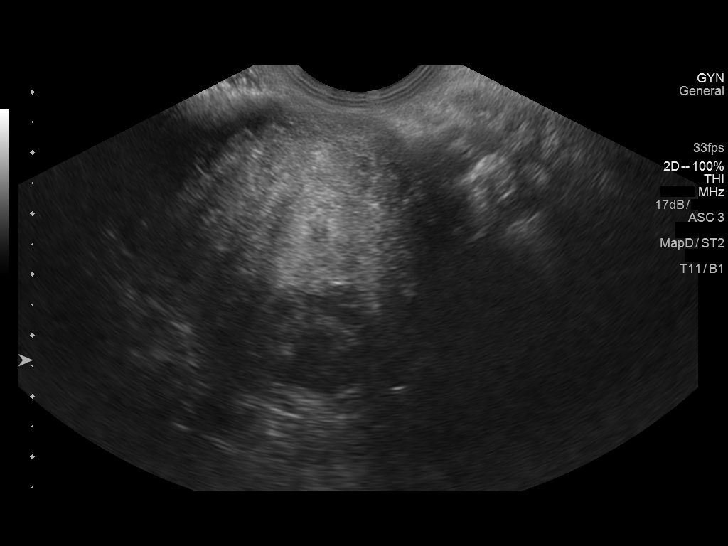
[im 55/95]
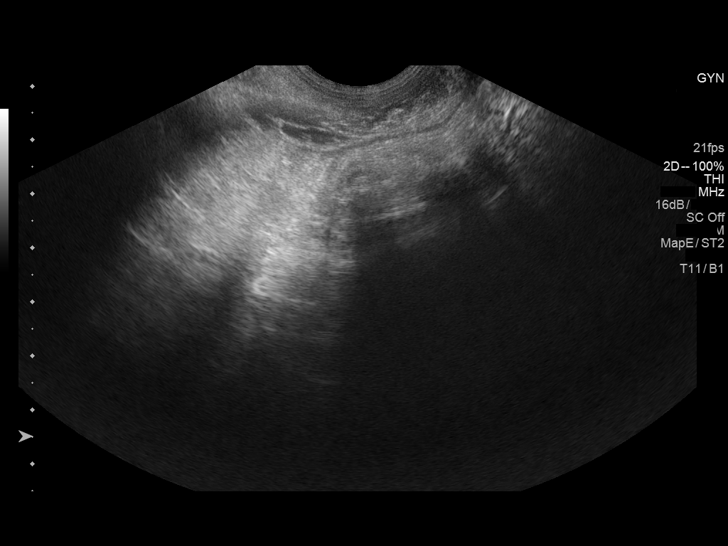
[im 63/95]
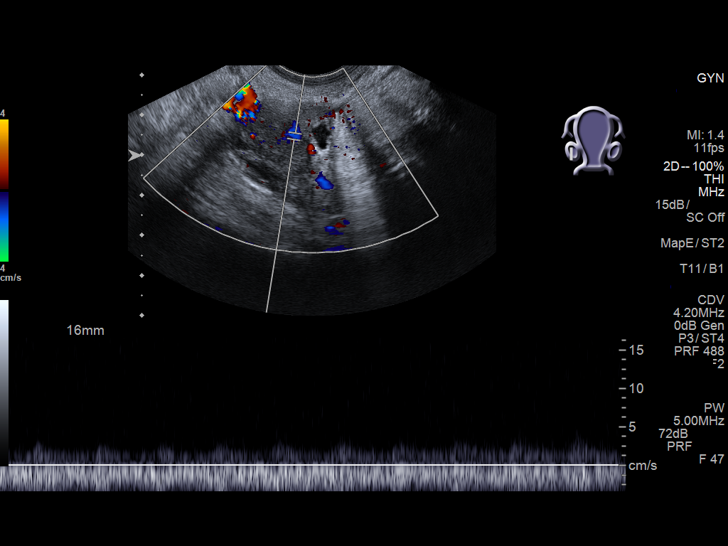
[im 71/95]
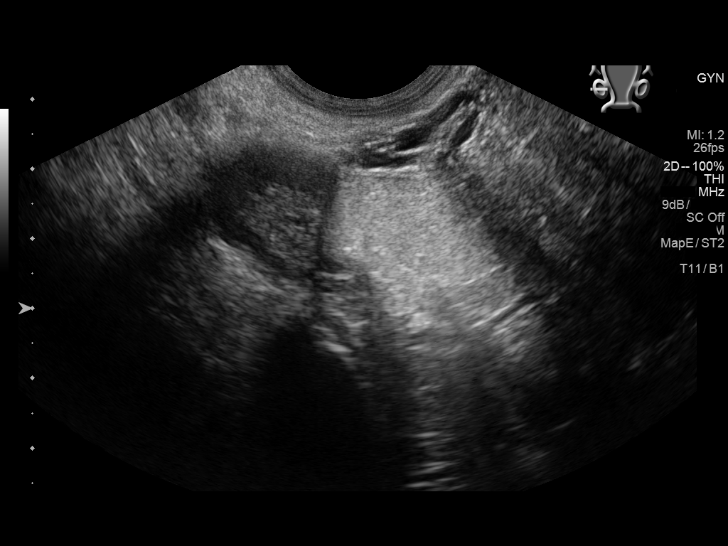
[im 79/95]
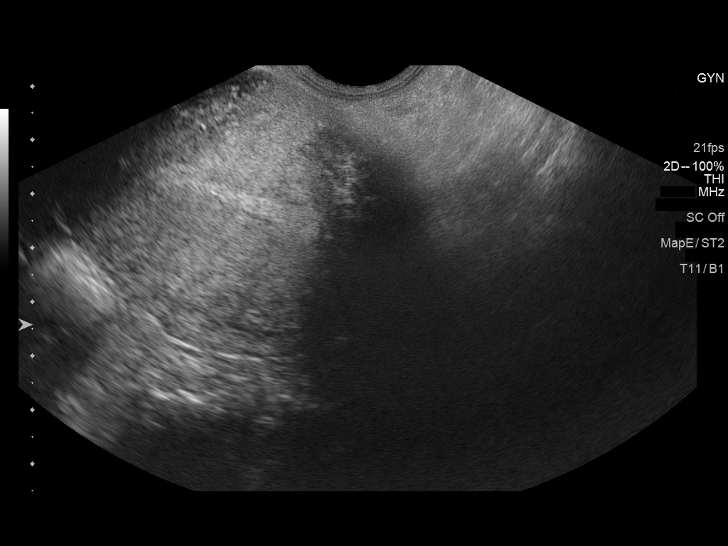
[im 87/95]
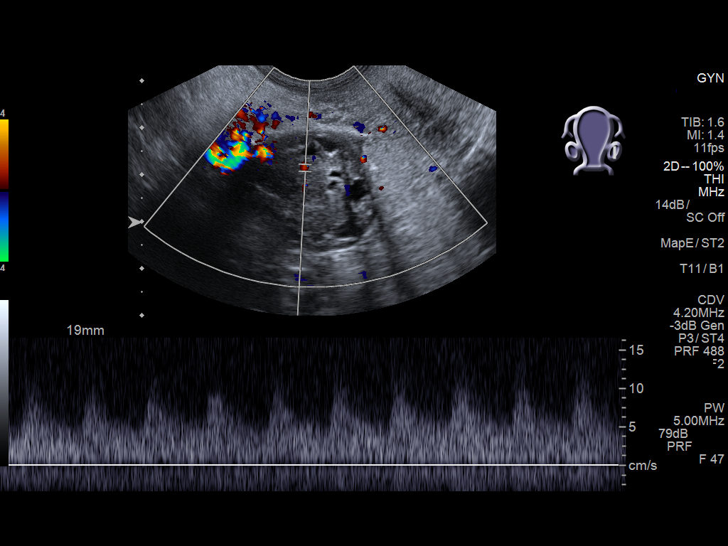
[im 95/95]
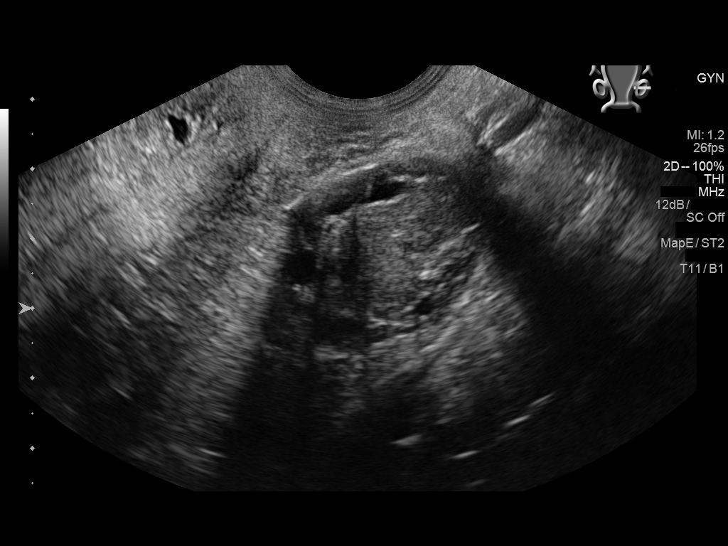

[13 of 25 positions shown; findings below may reference images not displayed]

FINDINGS: Uterus

Measurements: 8.3 x 5.1 x 6.4 cm. Posterior fibroid measures 3.1 x
2.6 x 2.3 cm

Endometrium

Thickness: 5 mm.  No focal abnormality visualized.

Right ovary

Measurements: 3.9 x 2.6 x 2.5 cm. There is normal blood flow.
Question of peripherally distributed follicles, but no increase in
number. No adnexal mass.

Left ovary

Measurements: 3.2 x 2.5 x 2.5 cm. There is normal blood flow.
Question of peripherally distributed follicles, but no increase in
number.

Pulsed Doppler evaluation of both ovaries demonstrates normal
low-resistance arterial and venous waveforms.

Other findings

No abnormal free fluid.
IMPRESSION: 1. Normal blood flow to both ovaries without torsion.
2. Question of peripherally distributed follicles in both ovaries
which can be seen in the setting of polycystic ovarian syndrome.
3. Posterior uterine fibroid measures 3.1 cm.
